# Patient Record
Sex: Male | Born: 1965 | Race: Black or African American | Hispanic: No | Marital: Married | Smoking: Current some day smoker
Health system: Southern US, Community
[De-identification: ages and names within clinical notes are randomized; demographics above are authoritative.]

---

## 2009-03-03 ENCOUNTER — Encounter: Admission: RE | Admit: 2009-03-03 | Discharge: 2009-03-03 | Payer: Self-pay | Admitting: Internal Medicine

## 2011-06-18 IMAGING — CR DG CHEST 1V
2 series · 2 of 2 positions shown · non-contrast
Comparison: None available.

CLINICAL DATA: Pre employment physical.

CHEST - 1 VIEW

[view not recorded (1 of 2)]
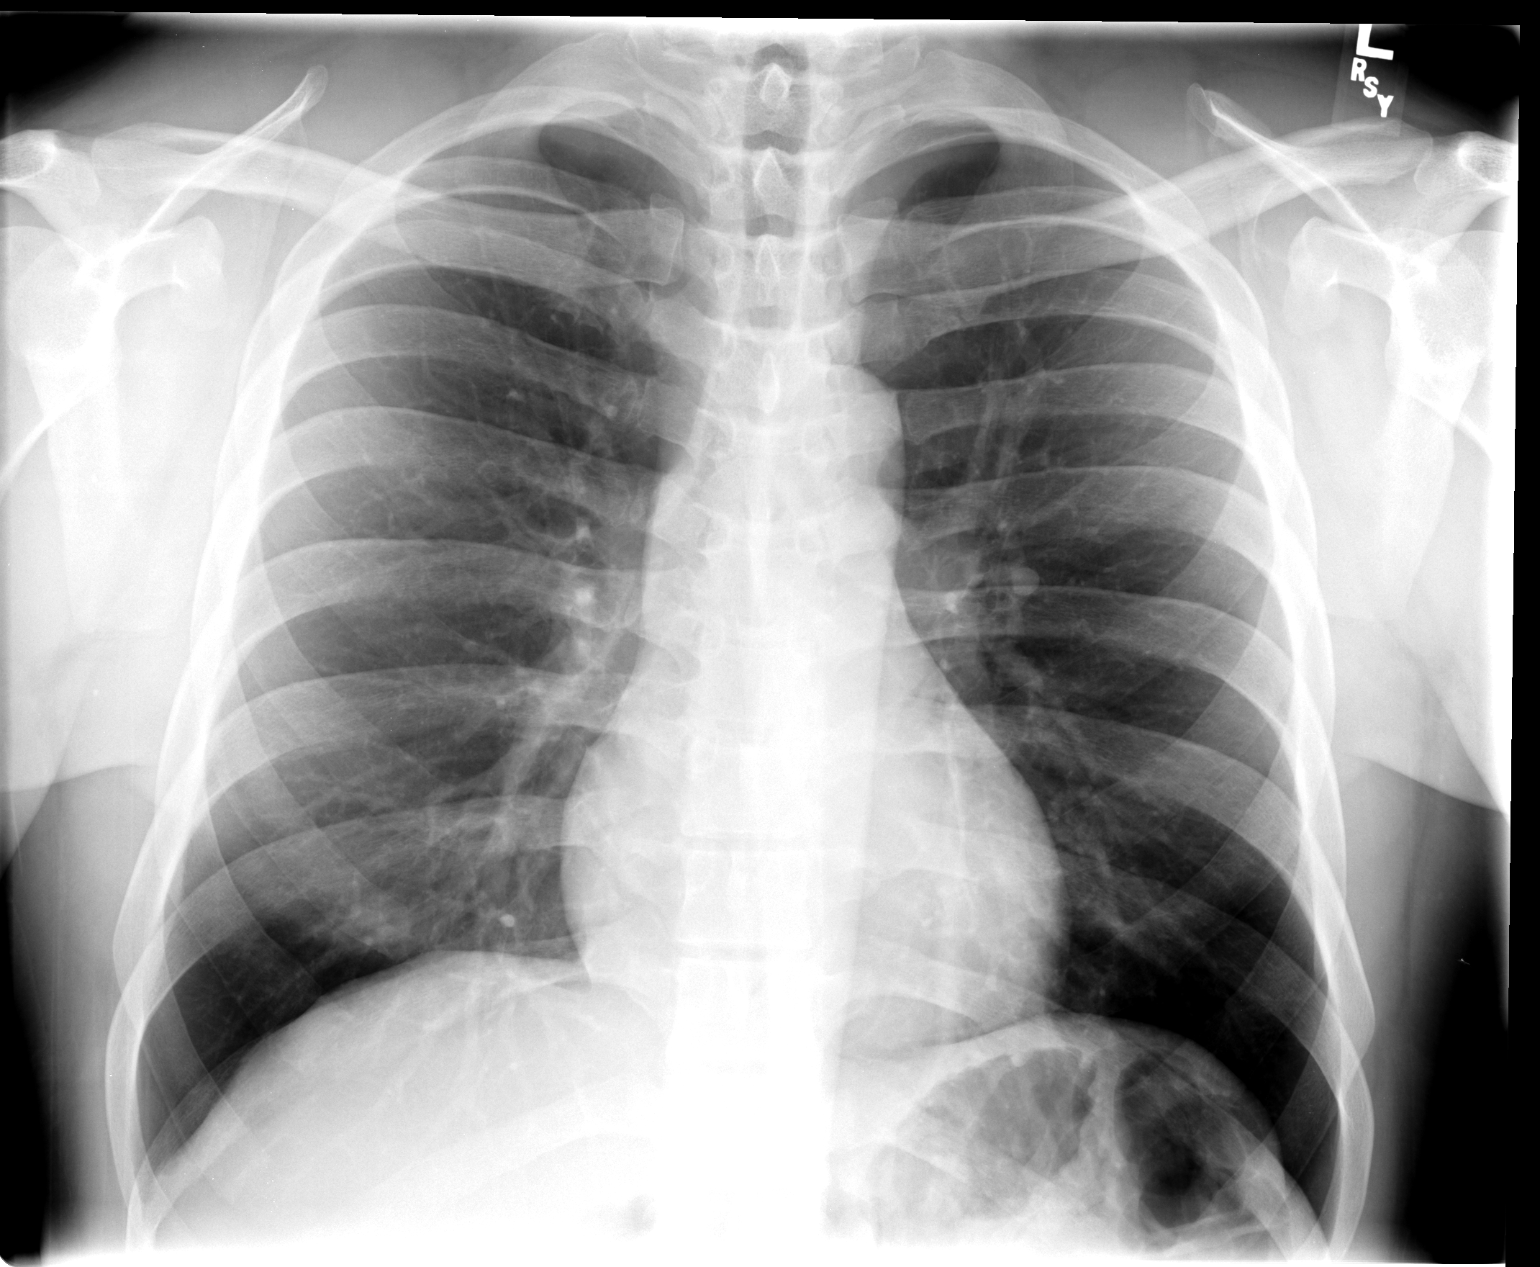

[view not recorded (2 of 2)]
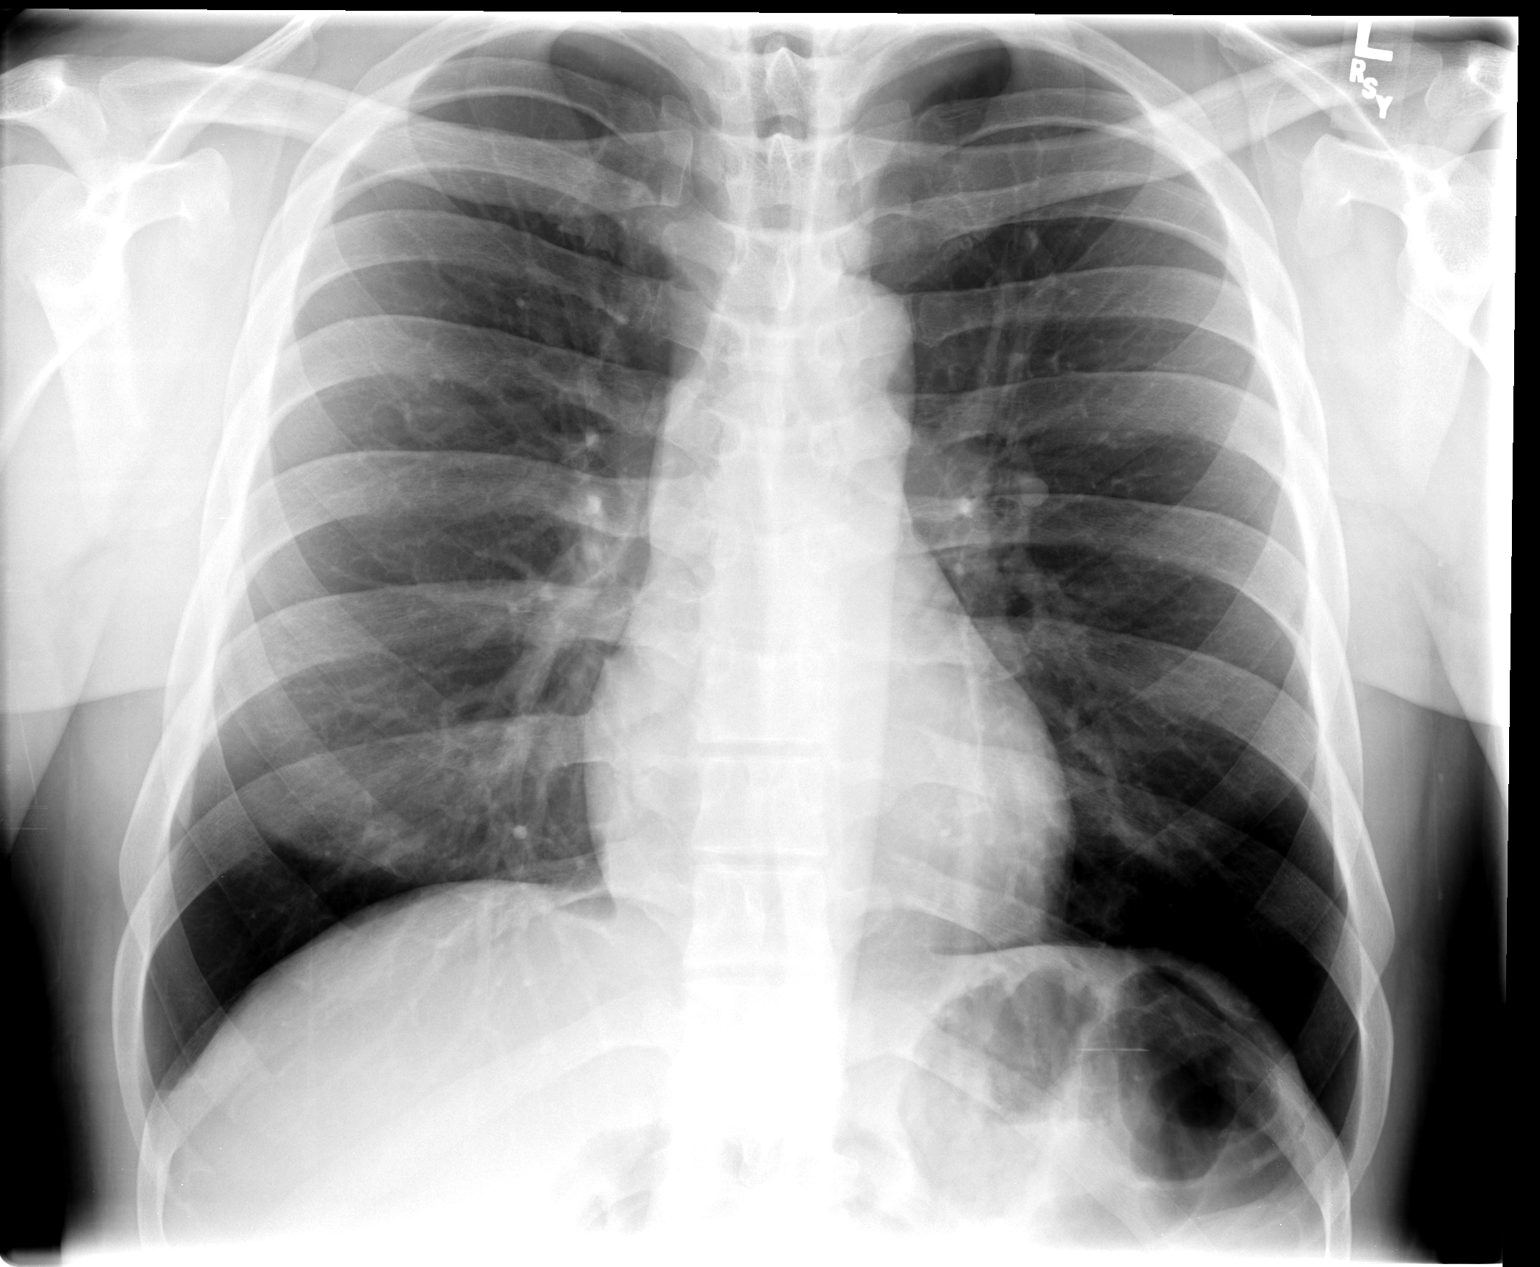

[2 of 2 positions shown; findings below may reference images not displayed]

FINDINGS: The lungs are clear.  Heart size is normal.  There is no
pleural effusion or focal bony abnormality.
IMPRESSION: Negative exam.

## 2020-09-15 ENCOUNTER — Ambulatory Visit: Payer: Self-pay | Admitting: Physician Assistant

## 2020-09-15 ENCOUNTER — Ambulatory Visit: Payer: Self-pay

## 2020-09-15 ENCOUNTER — Other Ambulatory Visit: Payer: Self-pay

## 2020-09-15 VITALS — BP 153/102 | HR 75 | Resp 18 | Ht 71.0 in | Wt 201.0 lb

## 2020-09-15 DIAGNOSIS — S61210A Laceration without foreign body of right index finger without damage to nail, initial encounter: Secondary | ICD-10-CM

## 2020-09-15 DIAGNOSIS — Z23 Encounter for immunization: Secondary | ICD-10-CM

## 2020-09-15 NOTE — Telephone Encounter (Signed)
Pt. Cut right index finger on a chain saw I hour ago. Cut is 1 ''. Stopped bleeding. Concerned about infection. Will go to UC.  Reason for Disposition . No prior tetanus shots (or is not fully vaccinated)  Answer Assessment - Initial Assessment Questions 1. APPEARANCE of INJURY: "What does the injury look like?"      Right index finger 2. SIZE: "How large is the cut?"      1 inch 3. BLEEDING: "Is it bleeding now?" If Yes, ask: "Is it difficult to stop?"      Stopped bleeding 4. LOCATION: "Where is the injury located?"      Right hand 5. ONSET: "How long ago did the injury occur?"      I hour 6. MECHANISM: "Tell me how it happened."      Chain saw 7. TETANUS: "When was the last tetanus booster?"     Unsure 8. PREGNANCY: "Is there any chance you are pregnant?" "When was your last menstrual period?"     n/a  Protocols used: CUTS AND LACERATIONS-A-AH

## 2020-09-15 NOTE — Patient Instructions (Signed)
I encourage you to keep the wound clean and dry, use Neosporin to help prevent infection.  Please let us know if there is any else we can do for you.  Jeremy Jaffe, PA-C Physician Assistant [facility] [email or web address]    Laceration Care, Adult A laceration is a cut that may go through all layers of the skin and into the tissue that is right under the skin. Some lacerations heal on their own. Others need to be closed with stitches (sutures), staples, skin adhesive strips, or skin glue. Proper care of a laceration reduces the risk for infection, helps the laceration heal better, and may prevent scarring. How to care for your laceration Wash your hands with soap and water before touching your wound or changing your bandage (dressing). If soap and water are not available, use hand sanitizer. Keep the wound clean and dry. If you were given a dressing, you should change it at least once a day, or as told by your health care provider. You should also change it if it becomes wet or dirty. If sutures or staples were used:  Keep the wound completely dry for the first 24 hours, or as told by your health care provider. After that time, you may shower or bathe. However, make sure that the wound is not soaked in water until after the sutures or staples have been removed.  Clean the wound once each day, or as told by your health care provider: ? Wash the wound with soap and water. ? Rinse the wound with water to remove all soap. ? Pat the wound dry with a clean towel. Do not rub the wound.  After cleaning the wound, apply a thin layer of antibiotic ointment as told by your health care provider. This will help prevent infection and keep the dressing from sticking to the wound.  Have the sutures or staples removed as told by your health care provider. If skin adhesive strips were used:  Do not get the skin adhesive strips wet. You may shower  or bathe, but be careful to keep the wound dry.  If the wound gets wet, pat it dry with a clean towel. Do not rub the wound.  Skin adhesive strips fall off on their own. You may trim the strips as the wound heals. Do not remove skin adhesive strips that are still stuck to the wound. They will fall off in time. If skin glue was used:  Try to keep the wound dry, but you may briefly wet it in the shower or bath. Do not soak the wound in water, such as by swimming.  After you have showered or bathed, gently pat the wound dry with a clean towel. Do not rub the wound.  Do not do any activities that will make you sweat heavily until the skin glue has fallen off on its own.  Do not apply liquid, cream, or ointment medicine to the wound while the skin glue is in place. Using those may loosen the film before the wound has healed.  If a dressing is placed over the wound, be careful not to apply tape directly over the skin glue. Doing that may cause the glue to be pulled off before the wound has healed.  Do not pick at the glue. Skin glue usually remains in place for 5-10 days and then falls off the skin. General instructions  Take over-the-counter and prescription medicines only as told by your health care provider.  If you were  prescribed an antibiotic medicine or ointment, take or apply it as told by your health care provider. Do not stop using it even if your condition improves.  Do not scratch or pick at the wound.  Check your wound every day for signs of infection. Watch for: ? Redness, swelling, or pain. ? Fluid, blood, or pus.  Raise (elevate) the injured area above the level of your heart while you are sitting or lying down for the first 24-48 hours after the laceration is repaired.  If directed, put ice on the affected area: ? Put ice in a plastic bag. ? Place a towel between your skin and the bag. ? Leave the ice on for 20 minutes, 2-3 times a day.  Keep all follow-up visits as  told by your health care provider. This is important.   Contact a health care provider if:  You received a tetanus shot and you have swelling, severe pain, redness, or bleeding at the injection site.  You have a fever.  A wound that was closed breaks open.  You notice a bad smell coming from your wound or your dressing.  You notice something coming out of the wound, such as wood or glass.  Your pain is not controlled with medicine.  You have increased redness, swelling, or pain at the site of your wound.  You have fluid, blood, or pus coming from your wound.  You need to change the dressing often due to fluid, blood, or pus that is draining from the wound.  You develop a new rash.  You develop numbness around the wound. Get help right away if:  You develop severe swelling around the wound.  Your pain suddenly increases and is severe.  You develop painful lumps near the wound or on skin anywhere else on your body.  You have a red streak going away from your wound.  The wound is on your hand or foot and you cannot properly move a finger or toe.  The wound is on your hand or foot, and you notice that your fingers or toes look pale or bluish. Summary  A laceration is a cut that may go through all layers of the skin and into the tissue that is right under the skin.  Some lacerations heal on their own. Others need to be closed with stitches (sutures), staples, skin adhesive strips, or skin glue.  Proper care of a laceration reduces the risk of infection, helps the laceration heal better, and prevents scarring. This information is not intended to replace advice given to you by your health care provider. Make sure you discuss any questions you have with your health care provider. Document Revised: 08/18/2017 Document Reviewed: 07/10/2017 Elsevier Patient Education  2021 [employer].

## 2020-09-15 NOTE — Progress Notes (Signed)
New Patient Office Visit  Subjective:  Patient ID: Jeremy Melendez, male    DOB: 06-24-1966  Age: 55 y.o. MRN: [id number]  CC:  Chief Complaint  Patient presents with  . Laceration    Right index finger    HPI Jeremy Melendez reports that he cut his right index finger approximately 1 hour ago while using a chainsaw.  Reports that he cleaned the wound well with water and peroxide.  Reports he also placed Neosporin on the wound before bandaging.  Denies any pain at this time.    History reviewed. No pertinent past medical history.  History reviewed. No pertinent surgical history.  History reviewed. No pertinent family history.  Social History   Socioeconomic History  . Marital status: Married    Spouse name: Not on file  . Number of children: Not on file  . Years of education: Not on file  . Highest education level: Not on file  Occupational History  . Not on file  Tobacco Use  . Smoking status: Current Some Day Smoker    Packs/day: 0.25    Types: Cigarettes, Cigars  . Smokeless tobacco: Never Used  . Tobacco comment: cigar  Substance and Sexual Activity  . Alcohol use: Not Currently  . Drug use: Not Currently  . Sexual activity: Yes    Comment: married  Other Topics Concern  . Not on file  Social History Narrative  . Not on file   Social Determinants of Health   Financial Resource Strain: Not on file  Food Insecurity: Not on file  Transportation Needs: Not on file  Physical Activity: Not on file  Stress: Not on file  Social Connections: Not on file  Intimate Partner Violence: Not on file    ROS Review of Systems  Constitutional: Negative for chills and fever.  HENT: Negative.   Eyes: Negative.   Respiratory: Negative.   Cardiovascular: Negative.   Gastrointestinal: Negative.   Endocrine: Negative.   Genitourinary: Negative.   Musculoskeletal: Negative.   Skin: Positive for wound.  Allergic/Immunologic: Negative.   Neurological: Negative.    Hematological: Negative.   Psychiatric/Behavioral: Negative.     Objective:   Today's Vitals: BP (!) 153/102 (BP Location: Left Arm, Patient Position: Sitting, Cuff Size: Normal)   Pulse 75   Resp 18   Ht 5\' 11"  (1.803 m)   Wt 201 lb (91.2 kg)   SpO2 97%   BMI 28.03 kg/m   Physical Exam Vitals and nursing note reviewed.  Constitutional:      Appearance: Normal appearance.  HENT:     Head: Normocephalic and atraumatic.     Right Ear: External ear normal.     Left Ear: External ear normal.     Nose: Nose normal.     Mouth/Throat:     Mouth: Mucous membranes are moist.     Pharynx: Oropharynx is clear.  Eyes:     Extraocular Movements: Extraocular movements intact.     Conjunctiva/sclera: Conjunctivae normal.     Pupils: Pupils are equal, round, and reactive to light.  Cardiovascular:     Rate and Rhythm: Normal rate and regular rhythm.     Pulses: Normal pulses.     Heart sounds: Normal heart sounds.  Pulmonary:     Effort: Pulmonary effort is normal.     Breath sounds: Normal breath sounds.  Musculoskeletal:        General: Normal range of motion.     Cervical back: Normal range of motion and  neck supple.  Skin:    Findings: Laceration present.       Neurological:     General: No focal deficit present.     Mental Status: He is alert and oriented to person, place, and time.  Psychiatric:        Mood and Affect: Mood normal.        Behavior: Behavior normal.        Thought Content: Thought content normal.        Judgment: Judgment normal.       Assessment & Plan:   Problem List Items Addressed This Visit      Other   Laceration of right index finger without foreign body without damage to nail - Primary   Relevant Orders   Tdap vaccine greater than or equal to 7yo IM (Completed)      Outpatient Encounter Medications as of 09/15/2020  Medication Sig  . amLODipine (NORVASC) 5 MG tablet Take 5 mg by mouth daily.  Marland Kitchen lisinopril-hydrochlorothiazide  (ZESTORETIC) 10-12.5 MG tablet Take 1 tablet by mouth daily.   No facility-administered encounter medications on file as of 09/15/2020.   1. Laceration of right index finger without foreign body without damage to nail, initial encounter Patient education given on keeping wound clean and dry.  Red flags given for prompt reevaluation.  Blood pressure elevated in clinic, patient states that his blood pressure is very well controlled at home, healthcare is managed by the [place].  - Tdap vaccine greater than or equal to 7yo IM   I have reviewed the patient's medical history (PMH, PSH, Social History, Family History, Medications, and allergies) , and have been updated if relevant. I spent 20 minutes reviewing chart and  face to face time with patient.    Follow-up: Return if symptoms worsen or fail to improve.   Jeremy Knudsen Mayers, PA-C

## 2020-09-15 NOTE — Progress Notes (Signed)
Patient reports cutting his right index finger 1 hr ago. Patient denies pain at this time. Patient ran water and peroxide on the area and placed a bandaid with neosporin on the site.

## 2020-09-17 DIAGNOSIS — S61210A Laceration without foreign body of right index finger without damage to nail, initial encounter: Secondary | ICD-10-CM | POA: Insufficient documentation

## 2021-02-17 ENCOUNTER — Ambulatory Visit
Admission: EM | Admit: 2021-02-17 | Discharge: 2021-02-17 | Disposition: A | Discharge status: Home / Self Care | Payer: No Typology Code available for payment source | Attending: Internal Medicine | Admitting: Internal Medicine

## 2021-02-17 ENCOUNTER — Other Ambulatory Visit: Payer: Self-pay

## 2021-02-17 ENCOUNTER — Encounter: Payer: Self-pay | Admitting: Emergency Medicine

## 2021-02-17 DIAGNOSIS — R52 Pain, unspecified: Secondary | ICD-10-CM

## 2021-02-17 DIAGNOSIS — R6889 Other general symptoms and signs: Secondary | ICD-10-CM

## 2021-02-17 DIAGNOSIS — J069 Acute upper respiratory infection, unspecified: Secondary | ICD-10-CM | POA: Diagnosis not present

## 2021-02-17 DIAGNOSIS — Z20822 Contact with and (suspected) exposure to covid-19: Secondary | ICD-10-CM

## 2021-02-17 LAB — POCT INFLUENZA A/B
Influenza A, POC: NEGATIVE
Influenza B, POC: NEGATIVE

## 2021-02-17 NOTE — Discharge Instructions (Signed)
You likely having a viral upper respiratory infection. We recommended symptom control. I expect your symptoms to start improving in the next 1-2 weeks.   1. Take a daily allergy pill/anti-histamine like Zyrtec, Claritin, or Store brand consistently for 2 weeks  2. For congestion you may try an oral decongestant like Mucinex or sudafed. You may also try intranasal flonase nasal spray or saline irrigations (neti pot, sinus cleanse)  3. For your sore throat you may try cepacol lozenges, salt water gargles, throat spray. Treatment of congestion may also help your sore throat.  4. For cough you may try Robitussen, Mucinex DM  5. Take Tylenol or Ibuprofen to help with pain/inflammation  6. Stay hydrated, drink plenty of fluids to keep throat coated and less irritated  Honey Tea For cough/sore throat try using a honey-based tea. Use 3 teaspoons of honey with juice squeezed from half lemon. Place shaved pieces of ginger into 1/2-1 cup of water and warm over stove top. Then mix the ingredients and repeat every 4 hours as needed.   Your rapid flu was negative in urgent care today.  COVID-19 viral swab is pending.  We will call if this is positive.

## 2021-02-17 NOTE — ED Triage Notes (Signed)
Patient c/o body aches and chills, no fever, no sore throat, no cough, no runny nose since yesterday.  Patient has taken Advil.  Patient is vaccinated for COVID.

## 2021-02-17 NOTE — ED Provider Notes (Signed)
[facility]    CSN: [id number] Arrival date & time: 02/17/21  0940      History   Chief Complaint Chief Complaint  Patient presents with   Generalized Body Aches    HPI Jeremy Melendez is a 55 y.o. male.   Patient presents with 2-day history of body aches, chills, nasal congestion.  Denies any known fevers or sick contacts at home.  Has taken Advil over-the-counter.  Denies any chest pain or shortness of breath.    History reviewed. No pertinent past medical history.  Patient Active Problem List   Diagnosis Date Noted   Laceration of right index finger without foreign body without damage to nail 09/17/2020    History reviewed. No pertinent surgical history.     Home Medications    Prior to Admission medications   Medication Sig Start Date End Date Taking? Authorizing Provider  amLODipine (NORVASC) 5 MG tablet Take 5 mg by mouth daily.   Yes [provider]  lisinopril-hydrochlorothiazide (ZESTORETIC) 10-12.5 MG tablet Take 1 tablet by mouth daily.   Yes [provider]    Family History No family history on file.  Social History Social History   Tobacco Use   Smoking status: Some Days    Packs/day: 0.25    Types: Cigarettes, Cigars   Smokeless tobacco: Never   Tobacco comments:    cigar  Substance Use Topics   Alcohol use: Not Currently   Drug use: Not Currently     Allergies   Patient has no known allergies.   Review of Systems Review of Systems Per HPI  Physical Exam Triage Vital Signs ED Triage Vitals  Enc Vitals Group     BP 02/17/21 1000 (!) 153/97     Pulse Rate 02/17/21 1000 81     Resp 02/17/21 1000 18     Temp 02/17/21 1000 99.3 F (37.4 C)     Temp Source 02/17/21 1000 Oral     SpO2 02/17/21 1000 96 %     Weight 02/17/21 1002 200 lb (90.7 kg)     Height 02/17/21 1002 5\' 11"  (1.803 m)     Head Circumference --      Peak Flow --      Pain Score 02/17/21 1002 7     Pain Loc --      Pain Edu? --       Excl. in GC? --    No data found.  Updated Vital Signs BP (!) 153/97 (BP Location: Left Arm)   Pulse 81   Temp 99.3 F (37.4 C) (Oral)   Resp 18   Ht 5\' 11"  (1.803 m)   Wt 200 lb (90.7 kg)   SpO2 96%   BMI 27.89 kg/m   Visual Acuity Right Eye Distance:   Left Eye Distance:   Bilateral Distance:    Right Eye Near:   Left Eye Near:    Bilateral Near:     Physical Exam Constitutional:      General: He is not in acute distress.    Appearance: Normal appearance.  HENT:     Head: Normocephalic and atraumatic.     Right Ear: Tympanic membrane and ear canal normal.     Left Ear: Tympanic membrane and ear canal normal.     Nose: Congestion present.     Mouth/Throat:     Mouth: Mucous membranes are moist.     Pharynx: No posterior oropharyngeal erythema.  Eyes:     Extraocular Movements:  Extraocular movements intact.     Conjunctiva/sclera: Conjunctivae normal.     Pupils: Pupils are equal, round, and reactive to light.  Cardiovascular:     Rate and Rhythm: Normal rate and regular rhythm.     Pulses: Normal pulses.     Heart sounds: Normal heart sounds.  Pulmonary:     Effort: Pulmonary effort is normal. No respiratory distress.     Breath sounds: Normal breath sounds. No wheezing.  Abdominal:     General: Abdomen is flat. Bowel sounds are normal.     Palpations: Abdomen is soft.  Musculoskeletal:        General: Normal range of motion.     Cervical back: Normal range of motion.  Skin:    General: Skin is warm and dry.  Neurological:     General: No focal deficit present.     Mental Status: He is alert and oriented to person, place, and time. Mental status is at baseline.  Psychiatric:        Mood and Affect: Mood normal.        Behavior: Behavior normal.     UC Treatments / Results  Labs (all labs ordered are listed, but only abnormal results are displayed) Labs Reviewed  NOVEL CORONAVIRUS, NAA  POCT INFLUENZA A/B    EKG   Radiology No results  found.  Procedures Procedures (including critical care time)  Medications Ordered in UC Medications - No data to display  Initial Impression / Assessment and Plan / UC Course  I have reviewed the triage vital signs and the nursing notes.  Pertinent labs & imaging results that were available during my care of the patient were reviewed by me and considered in my medical decision making (see chart for details).     Patient presents with symptoms likely from a viral upper respiratory infection. Differential includes bacterial pneumonia, sinusitis, allergic rhinitis, Covid 19. Do not suspect underlying cardiopulmonary process. Symptoms seem unlikely related to ACS, CHF or COPD exacerbations, pneumonia, pneumothorax. Patient is nontoxic appearing and not in need of emergent medical intervention.  Recommended symptom control with over the counter medications: Daily oral anti-histamine, Oral decongestant or IN corticosteroid, saline irrigations, cepacol lozenges, Robitussin, Delsym, honey tea.  Return if symptoms fail to improve in 1-2 weeks or you develop shortness of breath, chest pain, severe headache. Patient states understanding and is agreeable.  Discharged with PCP followup.  Final Clinical Impressions(s) / UC Diagnoses   Final diagnoses:  Generalized body aches  Flu-like symptoms  Acute upper respiratory infection  Encounter for laboratory testing for COVID-19 virus     Discharge Instructions      You likely having a viral upper respiratory infection. We recommended symptom control. I expect your symptoms to start improving in the next 1-2 weeks.   1. Take a daily allergy pill/anti-histamine like Zyrtec, Claritin, or Store brand consistently for 2 weeks  2. For congestion you may try an oral decongestant like Mucinex or sudafed. You may also try intranasal flonase nasal spray or saline irrigations (neti pot, sinus cleanse)  3. For your sore throat you may try cepacol  lozenges, salt water gargles, throat spray. Treatment of congestion may also help your sore throat.  4. For cough you may try Robitussen, Mucinex DM  5. Take Tylenol or Ibuprofen to help with pain/inflammation  6. Stay hydrated, drink plenty of fluids to keep throat coated and less irritated  Honey Tea For cough/sore throat try using a honey-based tea. Use 3  teaspoons of honey with juice squeezed from half lemon. Place shaved pieces of ginger into 1/2-1 cup of water and warm over stove top. Then mix the ingredients and repeat every 4 hours as needed.   Your rapid flu was negative in urgent care today.  COVID-19 viral swab is pending.  We will call if this is positive.     ED Prescriptions   None    PDMP not reviewed this encounter.   Lance Muss, FNP 02/17/21 [phone]

## 2021-02-18 LAB — NOVEL CORONAVIRUS, NAA: SARS-CoV-2, NAA: DETECTED — AB

## 2021-02-18 LAB — SARS-COV-2, NAA 2 DAY TAT
